# Patient Record
Sex: Female | Born: 1935 | Hispanic: No | Marital: Married
Health system: Southern US, Community
[De-identification: ages and names within clinical notes are randomized; demographics above are authoritative.]

---

## 2004-12-09 ENCOUNTER — Emergency Department: Payer: Self-pay | Admitting: Emergency Medicine

## 2005-06-21 ENCOUNTER — Other Ambulatory Visit: Payer: Self-pay

## 2005-06-21 ENCOUNTER — Inpatient Hospital Stay: Payer: Self-pay | Admitting: Internal Medicine

## 2010-08-19 ENCOUNTER — Inpatient Hospital Stay: Payer: Self-pay | Admitting: Internal Medicine

## 2010-08-19 DIAGNOSIS — I359 Nonrheumatic aortic valve disorder, unspecified: Secondary | ICD-10-CM

## 2010-08-19 DIAGNOSIS — I4891 Unspecified atrial fibrillation: Secondary | ICD-10-CM

## 2011-04-23 ENCOUNTER — Emergency Department: Payer: Self-pay | Admitting: Emergency Medicine

## 2012-05-21 ENCOUNTER — Emergency Department: Payer: Self-pay

## 2012-05-21 LAB — CBC
HCT: 27.1 % — ABNORMAL LOW (ref 35.0–47.0)
MCH: 23.1 pg — ABNORMAL LOW (ref 26.0–34.0)
MCV: 73 fL — ABNORMAL LOW (ref 80–100)
Platelet: 415 10*3/uL (ref 150–440)
RBC: 3.71 10*6/uL — ABNORMAL LOW (ref 3.80–5.20)
WBC: 10.7 10*3/uL (ref 3.6–11.0)

## 2012-05-21 LAB — URINALYSIS, COMPLETE
Bilirubin,UR: NEGATIVE
Ketone: NEGATIVE
Nitrite: NEGATIVE
RBC,UR: 9 /HPF (ref 0–5)
Squamous Epithelial: NONE SEEN
WBC UR: 36 /HPF (ref 0–5)

## 2012-05-21 LAB — BASIC METABOLIC PANEL
Anion Gap: 8 (ref 7–16)
Calcium, Total: 9.1 mg/dL (ref 8.5–10.1)
Chloride: 102 mmol/L (ref 98–107)
Co2: 28 mmol/L (ref 21–32)
Creatinine: 0.55 mg/dL — ABNORMAL LOW (ref 0.60–1.30)
Potassium: 4 mmol/L (ref 3.5–5.1)

## 2012-08-16 ENCOUNTER — Emergency Department: Payer: Self-pay | Admitting: Internal Medicine

## 2012-08-16 LAB — URINALYSIS, COMPLETE
Bilirubin,UR: NEGATIVE
Ketone: NEGATIVE
Nitrite: NEGATIVE
Ph: 6 (ref 4.5–8.0)
RBC,UR: 4 /HPF (ref 0–5)
Specific Gravity: 1.01 (ref 1.003–1.030)
Squamous Epithelial: NONE SEEN

## 2012-08-29 ENCOUNTER — Emergency Department: Payer: Self-pay | Admitting: Emergency Medicine

## 2012-08-29 LAB — COMPREHENSIVE METABOLIC PANEL
Chloride: 99 mmol/L (ref 98–107)
Co2: 24 mmol/L (ref 21–32)
EGFR (African American): >60
EGFR (Non-African Amer.): >60
Glucose: 328 mg/dL — ABNORMAL HIGH (ref 65–99)
Osmolality: 280 (ref 275–301)
Potassium: 3.5 mmol/L (ref 3.5–5.1)
SGOT(AST): 73 U/L — ABNORMAL HIGH (ref 15–37)
SGPT (ALT): 24 U/L (ref 12–78)
Total Protein: 7.2 g/dL (ref 6.4–8.2)

## 2012-08-29 LAB — URINALYSIS, COMPLETE
Bilirubin,UR: NEGATIVE
Blood: NEGATIVE
Glucose,UR: 150 mg/dL (ref 0–75)
Nitrite: NEGATIVE
Ph: 5 (ref 4.5–8.0)
RBC,UR: 5 /HPF (ref 0–5)
Specific Gravity: 1.024 (ref 1.003–1.030)
WBC UR: NONE SEEN /HPF (ref 0–5)

## 2012-08-29 LAB — CBC
HCT: 25.7 % — ABNORMAL LOW (ref 35.0–47.0)
MCH: 19.8 pg — ABNORMAL LOW (ref 26.0–34.0)
MCHC: 29.6 g/dL — ABNORMAL LOW (ref 32.0–36.0)
MCV: 67 fL — ABNORMAL LOW (ref 80–100)
Platelet: 495 10*3/uL — ABNORMAL HIGH (ref 150–440)
RBC: 3.84 10*6/uL (ref 3.80–5.20)
RDW: 19.1 % — ABNORMAL HIGH (ref 11.5–14.5)

## 2012-08-29 LAB — TROPONIN I: Troponin-I: 0.09 ng/mL — ABNORMAL HIGH

## 2012-09-04 LAB — CULTURE, BLOOD (SINGLE)

## 2012-09-06 ENCOUNTER — Ambulatory Visit: Payer: Self-pay | Admitting: Internal Medicine

## 2012-09-18 ENCOUNTER — Inpatient Hospital Stay: Payer: Self-pay | Admitting: Internal Medicine

## 2012-09-18 LAB — URINALYSIS, COMPLETE
Glucose,UR: >=500 mg/dL (ref 0–75)
Leukocyte Esterase: NEGATIVE
Nitrite: NEGATIVE
RBC,UR: NONE SEEN /HPF (ref 0–5)
Specific Gravity: 1.023 (ref 1.003–1.030)

## 2012-09-18 LAB — CBC
HCT: 22 % — ABNORMAL LOW (ref 35.0–47.0)
HGB: 6.2 g/dL — ABNORMAL LOW (ref 12.0–16.0)
MCH: 18.6 pg — ABNORMAL LOW (ref 26.0–34.0)
MCHC: 28.3 g/dL — ABNORMAL LOW (ref 32.0–36.0)
RBC: 3.35 10*6/uL — ABNORMAL LOW (ref 3.80–5.20)
RDW: 20.2 % — ABNORMAL HIGH (ref 11.5–14.5)

## 2012-09-18 LAB — IRON AND TIBC
Iron Bind.Cap.(Total): 211 ug/dL — ABNORMAL LOW (ref 250–450)
Iron: 17 ug/dL — ABNORMAL LOW (ref 50–170)
Unbound Iron-Bind.Cap.: 194 ug/dL

## 2012-09-18 LAB — COMPREHENSIVE METABOLIC PANEL
Albumin: 1.7 g/dL — ABNORMAL LOW (ref 3.4–5.0)
Alkaline Phosphatase: 115 U/L (ref 50–136)
BUN: 13 mg/dL (ref 7–18)
Bilirubin,Total: 0.3 mg/dL (ref 0.2–1.0)
Calcium, Total: 8.4 mg/dL — ABNORMAL LOW (ref 8.5–10.1)
Chloride: 106 mmol/L (ref 98–107)
Co2: 30 mmol/L (ref 21–32)
EGFR (African American): >60
EGFR (Non-African Amer.): >60
Glucose: 325 mg/dL — ABNORMAL HIGH (ref 65–99)
Osmolality: 294 (ref 275–301)
Potassium: 3 mmol/L — ABNORMAL LOW (ref 3.5–5.1)
SGPT (ALT): 23 U/L (ref 12–78)

## 2012-09-18 LAB — CK TOTAL AND CKMB (NOT AT ARMC)
CK, Total: 57 U/L (ref 21–215)
CK-MB: <0.5 ng/mL — ABNORMAL LOW (ref 0.5–3.6)

## 2012-09-18 LAB — TROPONIN I: Troponin-I: 0.05 ng/mL

## 2012-09-18 LAB — SEDIMENTATION RATE: Erythrocyte Sed Rate: >140 mm/hr — ABNORMAL HIGH (ref 0–30)

## 2012-09-19 LAB — BASIC METABOLIC PANEL
Anion Gap: 4 — ABNORMAL LOW (ref 7–16)
BUN: 16 mg/dL (ref 7–18)
Calcium, Total: 7.8 mg/dL — ABNORMAL LOW (ref 8.5–10.1)
Co2: 29 mmol/L (ref 21–32)
Creatinine: 0.54 mg/dL — ABNORMAL LOW (ref 0.60–1.30)
EGFR (African American): >60
EGFR (Non-African Amer.): >60
Osmolality: 291 (ref 275–301)

## 2012-09-19 LAB — CBC WITH DIFFERENTIAL/PLATELET
Basophil #: 0.1 10*3/uL (ref 0.0–0.1)
Basophil %: 0.3 %
Eosinophil #: 0.1 10*3/uL (ref 0.0–0.7)
Eosinophil %: 0.3 %
HGB: 7.6 g/dL — ABNORMAL LOW (ref 12.0–16.0)
Lymphocyte #: 1.8 10*3/uL (ref 1.0–3.6)
MCH: 21.3 pg — ABNORMAL LOW (ref 26.0–34.0)
MCHC: 30.8 g/dL — ABNORMAL LOW (ref 32.0–36.0)
MCV: 69 fL — ABNORMAL LOW (ref 80–100)
Monocyte #: 1 x10 3/mm — ABNORMAL HIGH (ref 0.2–0.9)
Neutrophil #: 14.1 10*3/uL — ABNORMAL HIGH (ref 1.4–6.5)
Neutrophil %: 82.8 %
Platelet: 337 10*3/uL (ref 150–440)
RBC: 3.59 10*6/uL — ABNORMAL LOW (ref 3.80–5.20)
RDW: 22.9 % — ABNORMAL HIGH (ref 11.5–14.5)

## 2012-09-20 LAB — CBC WITH DIFFERENTIAL/PLATELET
Bands: 3 %
HCT: 23.3 % — ABNORMAL LOW (ref 35.0–47.0)
MCH: 20.4 pg — ABNORMAL LOW (ref 26.0–34.0)
MCHC: 29.7 g/dL — ABNORMAL LOW (ref 32.0–36.0)
MCV: 69 fL — ABNORMAL LOW (ref 80–100)
RBC: 3.39 10*6/uL — ABNORMAL LOW (ref 3.80–5.20)
RDW: 23 % — ABNORMAL HIGH (ref 11.5–14.5)
WBC: 13.2 10*3/uL — ABNORMAL HIGH (ref 3.6–11.0)

## 2012-09-20 LAB — BASIC METABOLIC PANEL
Anion Gap: 7 (ref 7–16)
BUN: 13 mg/dL (ref 7–18)
Calcium, Total: 7.8 mg/dL — ABNORMAL LOW (ref 8.5–10.1)
Chloride: 110 mmol/L — ABNORMAL HIGH (ref 98–107)
Co2: 24 mmol/L (ref 21–32)
Creatinine: 0.64 mg/dL (ref 0.60–1.30)
EGFR (Non-African Amer.): >60
Osmolality: 288 (ref 275–301)
Potassium: 3.8 mmol/L (ref 3.5–5.1)

## 2012-09-21 LAB — CBC WITH DIFFERENTIAL/PLATELET
Bands: 5 %
Basophil: 1 %
Eosinophil: 2 %
HCT: 28.9 % — ABNORMAL LOW (ref 35.0–47.0)
HGB: 8.7 g/dL — ABNORMAL LOW (ref 12.0–16.0)
Lymphocytes: 7 %
MCHC: 30 g/dL — ABNORMAL LOW (ref 32.0–36.0)
Monocytes: 3 %
RBC: 4.07 10*6/uL (ref 3.80–5.20)
RDW: 24.6 % — ABNORMAL HIGH (ref 11.5–14.5)
Segmented Neutrophils: 82 %

## 2012-09-21 LAB — BASIC METABOLIC PANEL
Anion Gap: 8 (ref 7–16)
BUN: 10 mg/dL (ref 7–18)
Calcium, Total: 7.8 mg/dL — ABNORMAL LOW (ref 8.5–10.1)
Chloride: 111 mmol/L — ABNORMAL HIGH (ref 98–107)
Co2: 27 mmol/L (ref 21–32)
Creatinine: 0.6 mg/dL (ref 0.60–1.30)
EGFR (Non-African Amer.): >60
Potassium: 3.2 mmol/L — ABNORMAL LOW (ref 3.5–5.1)
Sodium: 146 mmol/L — ABNORMAL HIGH (ref 136–145)

## 2012-09-21 LAB — VANCOMYCIN, TROUGH: Vancomycin, Trough: 7 ug/mL — ABNORMAL LOW (ref 10–20)

## 2012-09-22 LAB — BASIC METABOLIC PANEL
Anion Gap: 7 (ref 7–16)
BUN: 14 mg/dL (ref 7–18)
Calcium, Total: 7.9 mg/dL — ABNORMAL LOW (ref 8.5–10.1)
Chloride: 111 mmol/L — ABNORMAL HIGH (ref 98–107)
Co2: 27 mmol/L (ref 21–32)
EGFR (African American): >60
EGFR (Non-African Amer.): >60
Glucose: 107 mg/dL — ABNORMAL HIGH (ref 65–99)
Potassium: 2.8 mmol/L — ABNORMAL LOW (ref 3.5–5.1)
Sodium: 145 mmol/L (ref 136–145)

## 2012-09-22 LAB — CBC WITH DIFFERENTIAL/PLATELET
Basophil #: 0.1 10*3/uL (ref 0.0–0.1)
Basophil %: 0.3 %
Eosinophil #: 0.1 10*3/uL (ref 0.0–0.7)
Eosinophil %: 0.6 %
Lymphocyte %: 9.6 %
MCH: 22 pg — ABNORMAL LOW (ref 26.0–34.0)
MCHC: 30.8 g/dL — ABNORMAL LOW (ref 32.0–36.0)
MCV: 71 fL — ABNORMAL LOW (ref 80–100)
Monocyte %: 5 %
Neutrophil %: 84.5 %
Platelet: 375 10*3/uL (ref 150–440)
RBC: 4.07 10*6/uL (ref 3.80–5.20)
WBC: 15.7 10*3/uL — ABNORMAL HIGH (ref 3.6–11.0)

## 2012-09-23 LAB — BASIC METABOLIC PANEL
Calcium, Total: 7.9 mg/dL — ABNORMAL LOW (ref 8.5–10.1)
EGFR (Non-African Amer.): 44 — ABNORMAL LOW
Glucose: 161 mg/dL — ABNORMAL HIGH (ref 65–99)
Potassium: 3.3 mmol/L — ABNORMAL LOW (ref 3.5–5.1)
Sodium: 146 mmol/L — ABNORMAL HIGH (ref 136–145)

## 2012-09-23 LAB — CBC WITH DIFFERENTIAL/PLATELET
Basophil #: 0.1 10*3/uL (ref 0.0–0.1)
HCT: 27.1 % — ABNORMAL LOW (ref 35.0–47.0)
HGB: 8.2 g/dL — ABNORMAL LOW (ref 12.0–16.0)
Lymphocyte #: 1.7 10*3/uL (ref 1.0–3.6)
Lymphocyte %: 10.8 %
MCHC: 30.3 g/dL — ABNORMAL LOW (ref 32.0–36.0)
Monocyte %: 6.1 %
Neutrophil %: 81.4 %
Platelet: 412 10*3/uL (ref 150–440)
RBC: 3.84 10*6/uL (ref 3.80–5.20)
WBC: 16 10*3/uL — ABNORMAL HIGH (ref 3.6–11.0)

## 2012-09-23 LAB — VANCOMYCIN, TROUGH: Vancomycin, Trough: 29 ug/mL (ref 10–20)

## 2012-09-24 LAB — BASIC METABOLIC PANEL
BUN: 16 mg/dL (ref 7–18)
Calcium, Total: 7.9 mg/dL — ABNORMAL LOW (ref 8.5–10.1)
Chloride: 112 mmol/L — ABNORMAL HIGH (ref 98–107)
EGFR (African American): 55 — ABNORMAL LOW
EGFR (Non-African Amer.): 47 — ABNORMAL LOW
Glucose: 203 mg/dL — ABNORMAL HIGH (ref 65–99)
Osmolality: 296 (ref 275–301)
Potassium: 4.4 mmol/L (ref 3.5–5.1)
Sodium: 145 mmol/L (ref 136–145)

## 2012-09-24 LAB — CBC WITH DIFFERENTIAL/PLATELET
Basophil #: 0.1 10*3/uL (ref 0.0–0.1)
Basophil %: 0.4 %
Eosinophil #: 0.3 10*3/uL (ref 0.0–0.7)
HGB: 7.6 g/dL — ABNORMAL LOW (ref 12.0–16.0)
MCH: 21.6 pg — ABNORMAL LOW (ref 26.0–34.0)
MCV: 72 fL — ABNORMAL LOW (ref 80–100)
Neutrophil %: 83.4 %
Platelet: 447 10*3/uL — ABNORMAL HIGH (ref 150–440)
WBC: 15.3 10*3/uL — ABNORMAL HIGH (ref 3.6–11.0)

## 2012-09-24 LAB — MAGNESIUM: Magnesium: 1.5 mg/dL — ABNORMAL LOW

## 2012-09-24 LAB — CULTURE, BLOOD (SINGLE)

## 2012-09-25 LAB — CBC WITH DIFFERENTIAL/PLATELET
Basophil %: 0.4 %
Eosinophil %: 2 %
HCT: 26.4 % — ABNORMAL LOW (ref 35.0–47.0)
Lymphocyte #: 1 10*3/uL (ref 1.0–3.6)
Lymphocyte %: 7.2 %
MCH: 21.4 pg — ABNORMAL LOW (ref 26.0–34.0)
MCHC: 29.5 g/dL — ABNORMAL LOW (ref 32.0–36.0)
Monocyte %: 5.4 %
Neutrophil #: 12.3 10*3/uL — ABNORMAL HIGH (ref 1.4–6.5)
Neutrophil %: 85 %
RBC: 3.64 10*6/uL — ABNORMAL LOW (ref 3.80–5.20)
RDW: 25.7 % — ABNORMAL HIGH (ref 11.5–14.5)

## 2012-09-25 LAB — VANCOMYCIN, TROUGH: Vancomycin, Trough: 26 ug/mL (ref 10–20)

## 2012-09-25 LAB — CREATININE, SERUM
Creatinine: 1.1 mg/dL (ref 0.60–1.30)
EGFR (Non-African Amer.): 47 — ABNORMAL LOW

## 2012-09-26 LAB — CBC WITH DIFFERENTIAL/PLATELET
Basophil #: 0.1 10*3/uL (ref 0.0–0.1)
Eosinophil #: 0.3 10*3/uL (ref 0.0–0.7)
Eosinophil %: 2.5 %
HCT: 26.5 % — ABNORMAL LOW (ref 35.0–47.0)
HGB: 7.8 g/dL — ABNORMAL LOW (ref 12.0–16.0)
Lymphocyte #: 1.4 10*3/uL (ref 1.0–3.6)
MCH: 21.4 pg — ABNORMAL LOW (ref 26.0–34.0)
MCHC: 29.6 g/dL — ABNORMAL LOW (ref 32.0–36.0)
MCV: 72 fL — ABNORMAL LOW (ref 80–100)
Monocyte %: 6.5 %
Neutrophil #: 10.8 10*3/uL — ABNORMAL HIGH (ref 1.4–6.5)
Platelet: 607 10*3/uL — ABNORMAL HIGH (ref 150–440)
RDW: 25.9 % — ABNORMAL HIGH (ref 11.5–14.5)
WBC: 13.5 10*3/uL — ABNORMAL HIGH (ref 3.6–11.0)

## 2012-09-26 LAB — CREATININE, SERUM
Creatinine: 1.27 mg/dL (ref 0.60–1.30)
EGFR (African American): 46 — ABNORMAL LOW
EGFR (Non-African Amer.): 40 — ABNORMAL LOW

## 2012-09-26 LAB — VANCOMYCIN, RANDOM: Vancomycin, Random: 27 ug/mL

## 2012-10-02 LAB — COMPREHENSIVE METABOLIC PANEL
Albumin: 1.6 g/dL — ABNORMAL LOW (ref 3.4–5.0)
Alkaline Phosphatase: 131 U/L (ref 50–136)
BUN: 19 mg/dL — ABNORMAL HIGH (ref 7–18)
Calcium, Total: 8.5 mg/dL (ref 8.5–10.1)
Chloride: 116 mmol/L — ABNORMAL HIGH (ref 98–107)
Glucose: 317 mg/dL — ABNORMAL HIGH (ref 65–99)
Osmolality: 305 (ref 275–301)
Potassium: 3.8 mmol/L (ref 3.5–5.1)
SGPT (ALT): 22 U/L (ref 12–78)
Sodium: 146 mmol/L — ABNORMAL HIGH (ref 136–145)

## 2012-10-02 LAB — CBC
HCT: 28.2 % — ABNORMAL LOW (ref 35.0–47.0)
HGB: 8.4 g/dL — ABNORMAL LOW (ref 12.0–16.0)
MCH: 21.4 pg — ABNORMAL LOW (ref 26.0–34.0)
MCHC: 29.9 g/dL — ABNORMAL LOW (ref 32.0–36.0)
MCV: 72 fL — ABNORMAL LOW (ref 80–100)
Platelet: 655 10*3/uL — ABNORMAL HIGH (ref 150–440)
RDW: 25.8 % — ABNORMAL HIGH (ref 11.5–14.5)
WBC: 22.9 10*3/uL — ABNORMAL HIGH (ref 3.6–11.0)

## 2012-10-03 ENCOUNTER — Inpatient Hospital Stay: Payer: Self-pay | Admitting: Internal Medicine

## 2012-10-03 LAB — URINALYSIS, COMPLETE
Ph: 5 (ref 4.5–8.0)
RBC,UR: 40 /HPF (ref 0–5)
Specific Gravity: 1.017 (ref 1.003–1.030)

## 2012-10-04 LAB — CBC WITH DIFFERENTIAL/PLATELET
Basophil #: 0.1 10*3/uL (ref 0.0–0.1)
Eosinophil #: 0.4 10*3/uL (ref 0.0–0.7)
Eosinophil %: 2.3 %
HGB: 6.9 g/dL — ABNORMAL LOW (ref 12.0–16.0)
Lymphocyte #: 1.1 10*3/uL (ref 1.0–3.6)
Lymphocyte %: 7.1 %
MCH: 21 pg — ABNORMAL LOW (ref 26.0–34.0)
MCHC: 29.5 g/dL — ABNORMAL LOW (ref 32.0–36.0)
Neutrophil %: 84.1 %
RDW: 25.7 % — ABNORMAL HIGH (ref 11.5–14.5)

## 2012-10-04 LAB — URINE CULTURE

## 2012-10-04 LAB — BASIC METABOLIC PANEL
BUN: 19 mg/dL — ABNORMAL HIGH (ref 7–18)
Calcium, Total: 8.1 mg/dL — ABNORMAL LOW (ref 8.5–10.1)
Chloride: 115 mmol/L — ABNORMAL HIGH (ref 98–107)
Glucose: 281 mg/dL — ABNORMAL HIGH (ref 65–99)
Osmolality: 307 (ref 275–301)

## 2012-10-04 LAB — GLUCOSE, RANDOM: Glucose: 457 mg/dL — ABNORMAL HIGH (ref 65–99)

## 2012-10-05 LAB — CBC WITH DIFFERENTIAL/PLATELET
Basophil #: 0.1 10*3/uL (ref 0.0–0.1)
Basophil %: 0.6 %
HCT: 27.3 % — ABNORMAL LOW (ref 35.0–47.0)
HGB: 8.7 g/dL — ABNORMAL LOW (ref 12.0–16.0)
Lymphocyte #: 1 10*3/uL (ref 1.0–3.6)
Lymphocyte %: 8 %
Monocyte #: 0.7 x10 3/mm (ref 0.2–0.9)
Monocyte %: 5.8 %
Neutrophil #: 10.5 10*3/uL — ABNORMAL HIGH (ref 1.4–6.5)
Neutrophil %: 82.9 %
Platelet: 407 10*3/uL (ref 150–440)
RBC: 3.69 10*6/uL — ABNORMAL LOW (ref 3.80–5.20)
RDW: 25.3 % — ABNORMAL HIGH (ref 11.5–14.5)
WBC: 12.7 10*3/uL — ABNORMAL HIGH (ref 3.6–11.0)

## 2012-10-05 LAB — BASIC METABOLIC PANEL
BUN: 14 mg/dL (ref 7–18)
Calcium, Total: 7.9 mg/dL — ABNORMAL LOW (ref 8.5–10.1)
Co2: 21 mmol/L (ref 21–32)
EGFR (African American): 56 — ABNORMAL LOW
EGFR (Non-African Amer.): 48 — ABNORMAL LOW
Osmolality: 301 (ref 275–301)
Potassium: 3.4 mmol/L — ABNORMAL LOW (ref 3.5–5.1)
Sodium: 151 mmol/L — ABNORMAL HIGH (ref 136–145)

## 2012-10-05 LAB — VANCOMYCIN, TROUGH: Vancomycin, Trough: 19 ug/mL (ref 10–20)

## 2012-10-06 LAB — CBC WITH DIFFERENTIAL/PLATELET
Basophil #: 0.1 10*3/uL (ref 0.0–0.1)
Eosinophil #: 0.3 10*3/uL (ref 0.0–0.7)
HGB: 9.7 g/dL — ABNORMAL LOW (ref 12.0–16.0)
Lymphocyte #: 1.1 10*3/uL (ref 1.0–3.6)
Lymphocyte %: 9 %
MCHC: 31.1 g/dL — ABNORMAL LOW (ref 32.0–36.0)
MCV: 75 fL — ABNORMAL LOW (ref 80–100)
Monocyte #: 0.7 x10 3/mm (ref 0.2–0.9)
Monocyte %: 5.5 %
Platelet: 452 10*3/uL — ABNORMAL HIGH (ref 150–440)
RBC: 4.16 10*6/uL (ref 3.80–5.20)
RDW: 25.7 % — ABNORMAL HIGH (ref 11.5–14.5)
WBC: 12.2 10*3/uL — ABNORMAL HIGH (ref 3.6–11.0)

## 2012-10-06 LAB — BASIC METABOLIC PANEL
Anion Gap: 7 (ref 7–16)
BUN: 11 mg/dL (ref 7–18)
EGFR (African American): 50 — ABNORMAL LOW
EGFR (Non-African Amer.): 43 — ABNORMAL LOW
Glucose: 223 mg/dL — ABNORMAL HIGH (ref 65–99)
Potassium: 3.3 mmol/L — ABNORMAL LOW (ref 3.5–5.1)
Sodium: 149 mmol/L — ABNORMAL HIGH (ref 136–145)

## 2012-10-07 ENCOUNTER — Ambulatory Visit: Payer: Self-pay | Admitting: Internal Medicine

## 2012-10-07 LAB — CBC WITH DIFFERENTIAL/PLATELET
Basophil %: 0.6 %
Eosinophil #: 0.3 10*3/uL (ref 0.0–0.7)
Lymphocyte #: 0.9 10*3/uL — ABNORMAL LOW (ref 1.0–3.6)
MCH: 22.6 pg — ABNORMAL LOW (ref 26.0–34.0)
MCHC: 30.6 g/dL — ABNORMAL LOW (ref 32.0–36.0)
Monocyte #: 0.6 x10 3/mm (ref 0.2–0.9)
Monocyte %: 5.1 %
Neutrophil #: 9.7 10*3/uL — ABNORMAL HIGH (ref 1.4–6.5)
Neutrophil %: 83.6 %
RBC: 3.65 10*6/uL — ABNORMAL LOW (ref 3.80–5.20)
RDW: 26.3 % — ABNORMAL HIGH (ref 11.5–14.5)
WBC: 11.6 10*3/uL — ABNORMAL HIGH (ref 3.6–11.0)

## 2012-10-07 LAB — MAGNESIUM: Magnesium: 1.9 mg/dL

## 2012-10-07 LAB — BASIC METABOLIC PANEL
BUN: 9 mg/dL (ref 7–18)
Chloride: 116 mmol/L — ABNORMAL HIGH (ref 98–107)
Creatinine: 1.12 mg/dL (ref 0.60–1.30)
EGFR (Non-African Amer.): 46 — ABNORMAL LOW
Osmolality: 301 (ref 275–301)

## 2012-10-07 LAB — POTASSIUM: Potassium: 3.3 mmol/L — ABNORMAL LOW (ref 3.5–5.1)

## 2012-10-08 LAB — CULTURE, BLOOD (SINGLE)

## 2012-12-05 ENCOUNTER — Emergency Department: Payer: Self-pay | Admitting: Emergency Medicine

## 2012-12-05 LAB — CBC
HGB: 7.8 g/dL — ABNORMAL LOW (ref 12.0–16.0)
MCHC: 31.1 g/dL — ABNORMAL LOW (ref 32.0–36.0)
MCV: 78 fL — ABNORMAL LOW (ref 80–100)
RBC: 3.22 10*6/uL — ABNORMAL LOW (ref 3.80–5.20)
RDW: 21.4 % — ABNORMAL HIGH (ref 11.5–14.5)
WBC: 15.9 10*3/uL — ABNORMAL HIGH (ref 3.6–11.0)

## 2012-12-05 LAB — COMPREHENSIVE METABOLIC PANEL
Albumin: 1.2 g/dL — ABNORMAL LOW (ref 3.4–5.0)
Alkaline Phosphatase: 119 U/L (ref 50–136)
Bilirubin,Total: <0.1 mg/dL — ABNORMAL LOW (ref 0.2–1.0)
Co2: 23 mmol/L (ref 21–32)
Glucose: 123 mg/dL — ABNORMAL HIGH (ref 65–99)
Osmolality: 278 (ref 275–301)
Potassium: 3.6 mmol/L (ref 3.5–5.1)
SGOT(AST): 63 U/L — ABNORMAL HIGH (ref 15–37)
SGPT (ALT): 15 U/L (ref 12–78)
Sodium: 139 mmol/L (ref 136–145)
Total Protein: 5.3 g/dL — ABNORMAL LOW (ref 6.4–8.2)

## 2012-12-05 LAB — TROPONIN I: Troponin-I: 0.02 ng/mL

## 2013-03-09 DEATH — deceased

## 2014-01-08 IMAGING — CR PELVIS - 1-2 VIEW
1 series · 1 of 1 positions shown · non-contrast
Comparison: none

REASON FOR EXAM: left leg pain per family
COMMENTS:

[ap]
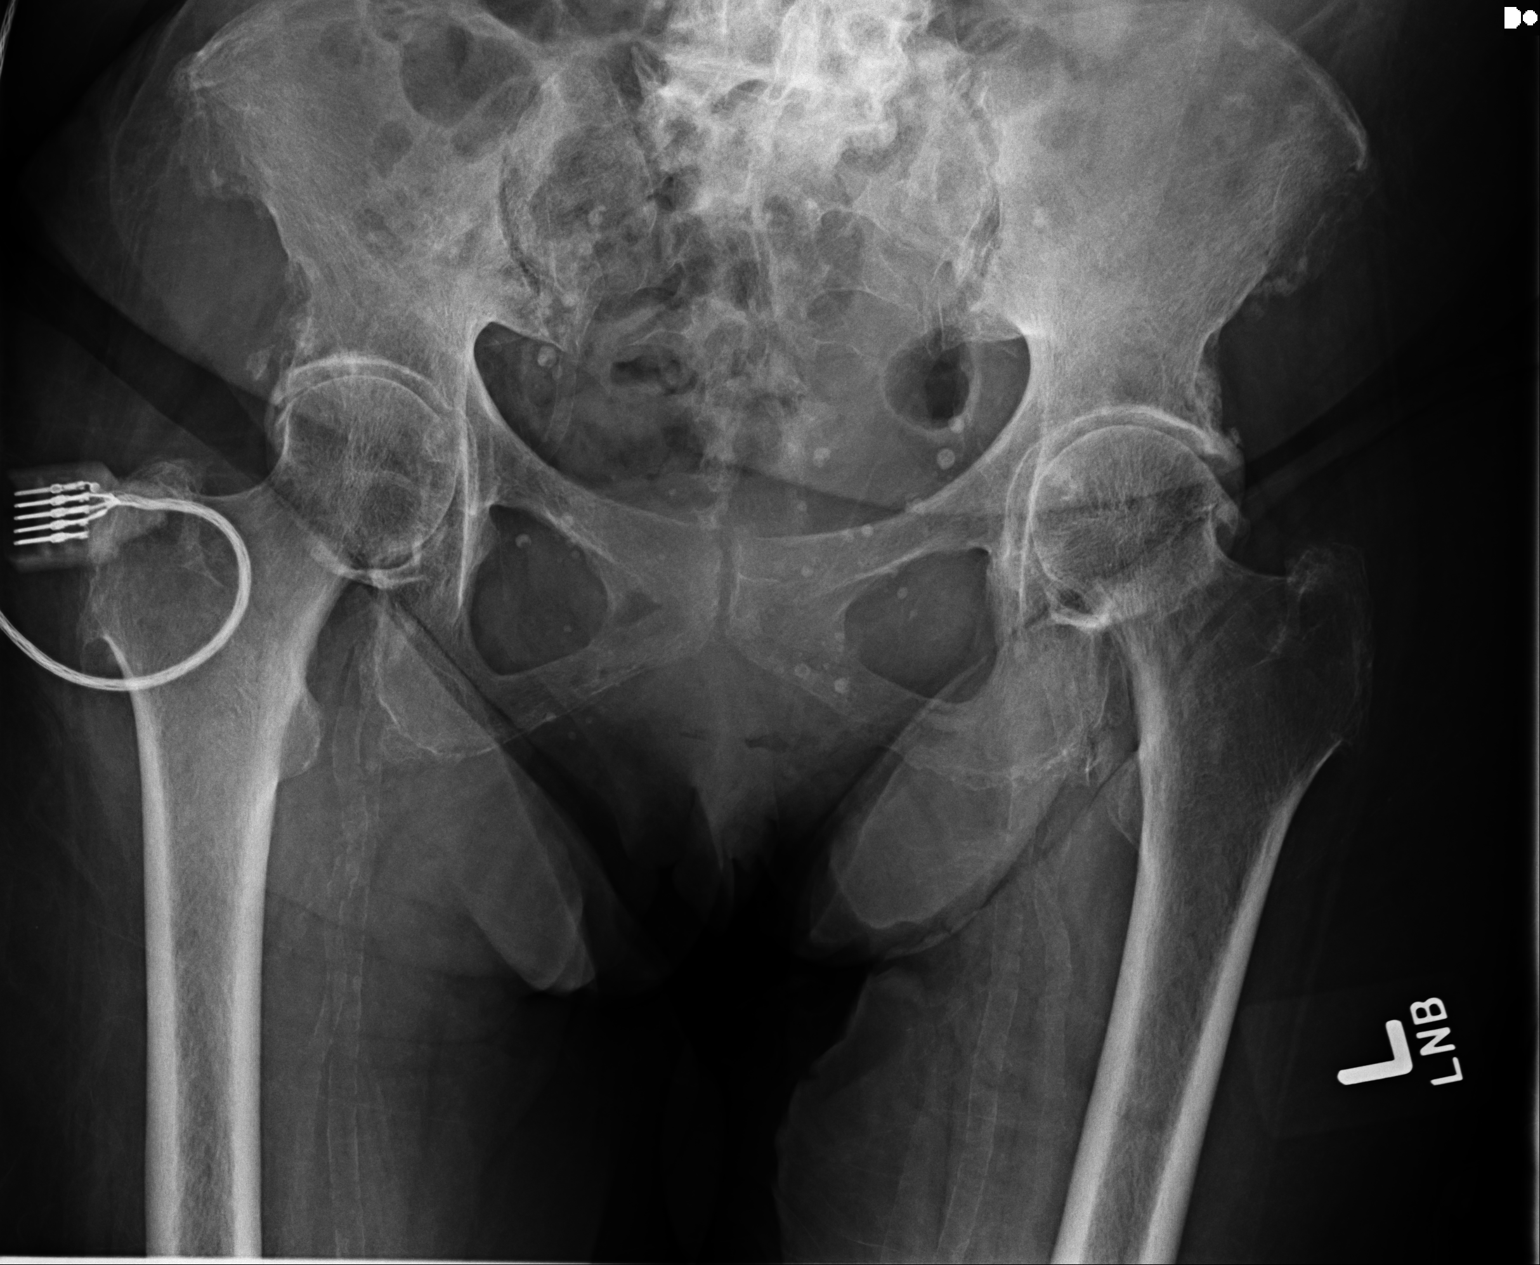

[1 of 1 positions shown; findings below may reference images not displayed]

PROCEDURE:     DXR - DXR PELVIS AP ONLY  - August 29, 2012 [DATE]

RESULT:     Single image of the pelvis does not include the upper iliac
crest region. Lumbar degenerative changes in lumbosacral degenerative
changes are present. There does not appear to be definite pelvic fracture or
hip fracture evident. If the patient has symptoms in the pubic region then
dedicated pubic bone imaging would be beneficial.
IMPRESSION: 1. Degenerative changes. Poindexter atherosclerotic calcification is present. No
acute bony abnormality evident.

[REDACTED]

## 2014-01-08 IMAGING — CR DG CHEST 1V PORT
1 series · 1 of 1 positions shown · non-contrast
Comparison: none

REASON FOR EXAM: ams
COMMENTS:

[ap]
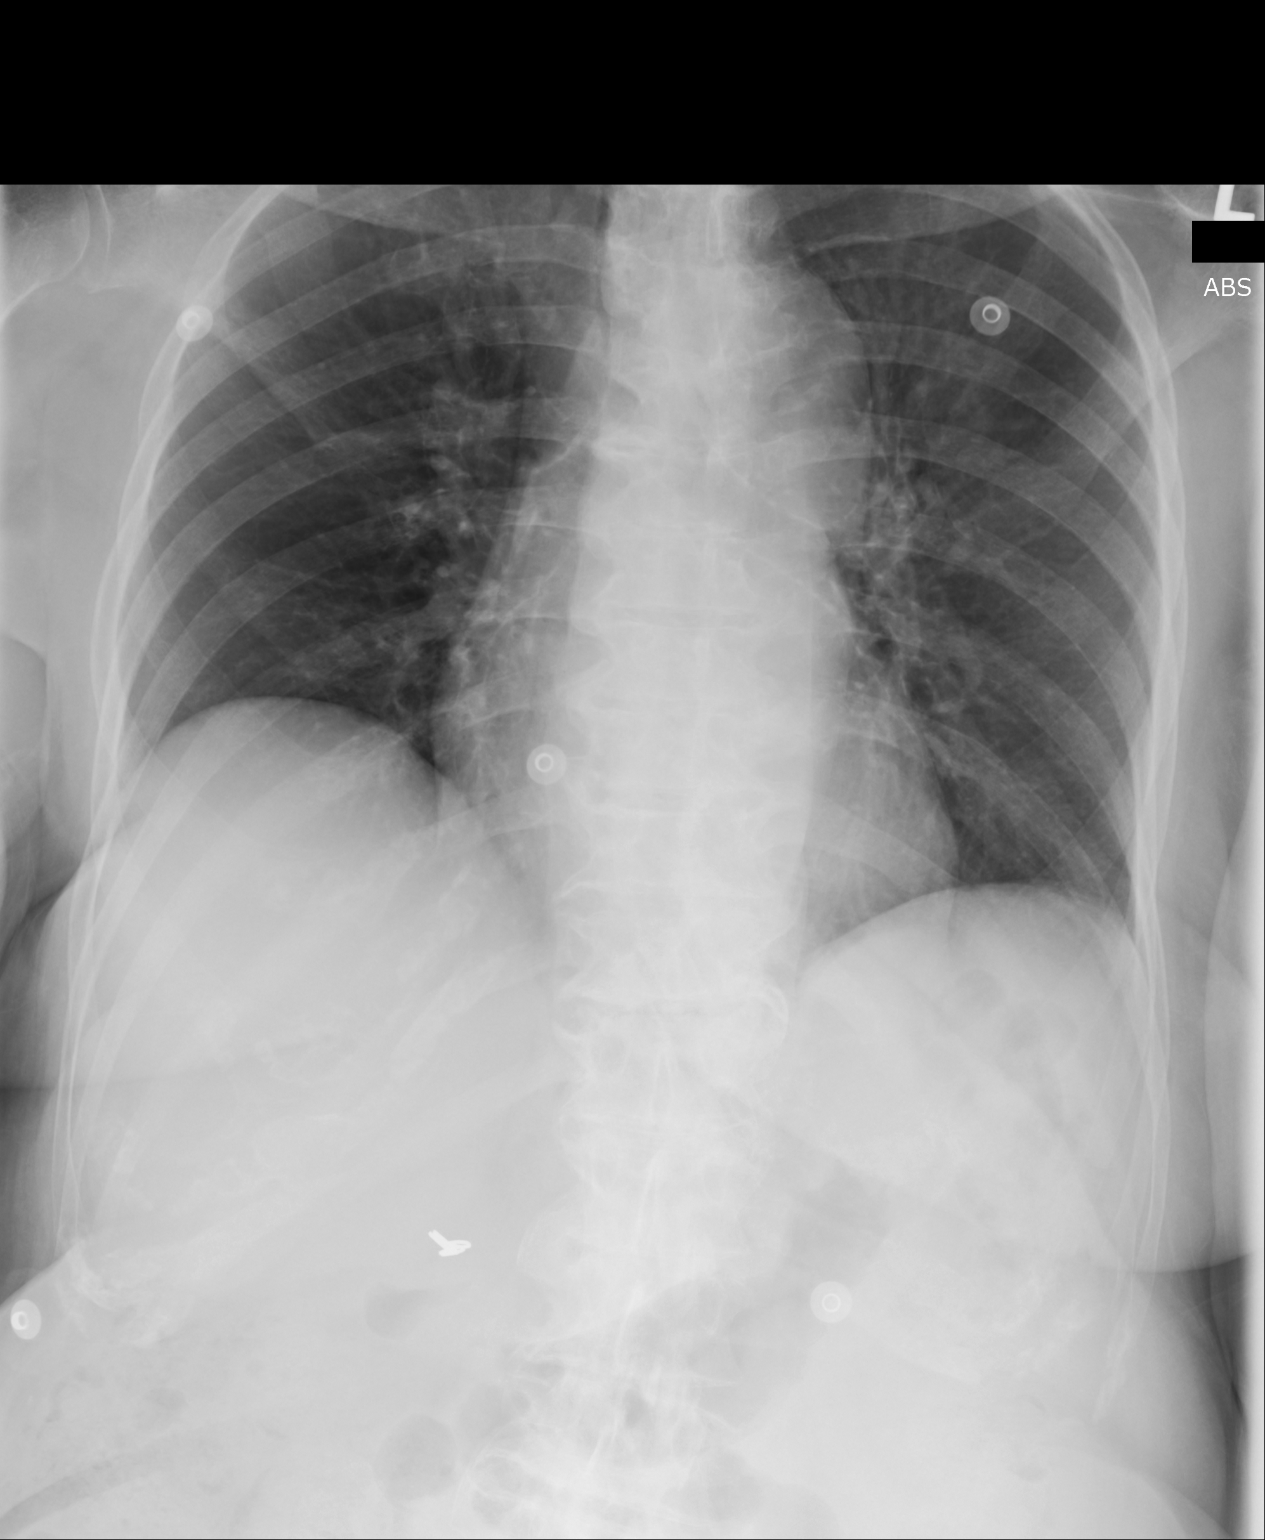

[1 of 1 positions shown; findings below may reference images not displayed]

PROCEDURE:     DXR - DXR PORTABLE CHEST SINGLE VIEW  - August 29, 2012 [DATE]

RESULT:     Comparison is made to the previous exam dated 23 April, 2011.

There is hazy increased density over the right upper lobe. Followup PA and
lateral views are recommended to assess for right upper lobe pneumonia. The
lungs otherwise appear clear. The heart is normal in size.
IMPRESSION: Please see above.

[REDACTED]

## 2014-01-08 IMAGING — CR DG FEMUR 2V*R*
2 series · 5 of 5 positions shown · non-contrast
Comparison: none

REASON FOR EXAM: right femur pain
COMMENTS:

PROCEDURE:     DXR - DXR FEMUR RIGHT  - August 29, 2012 [DATE]
RESULT:     Images of the right femur show degenerative changes in the right
knee and right hip without fracture or dislocation.

[Series 1: ap · 0.17mm/px · 3 of 3 slices shown]
[im 1/3]
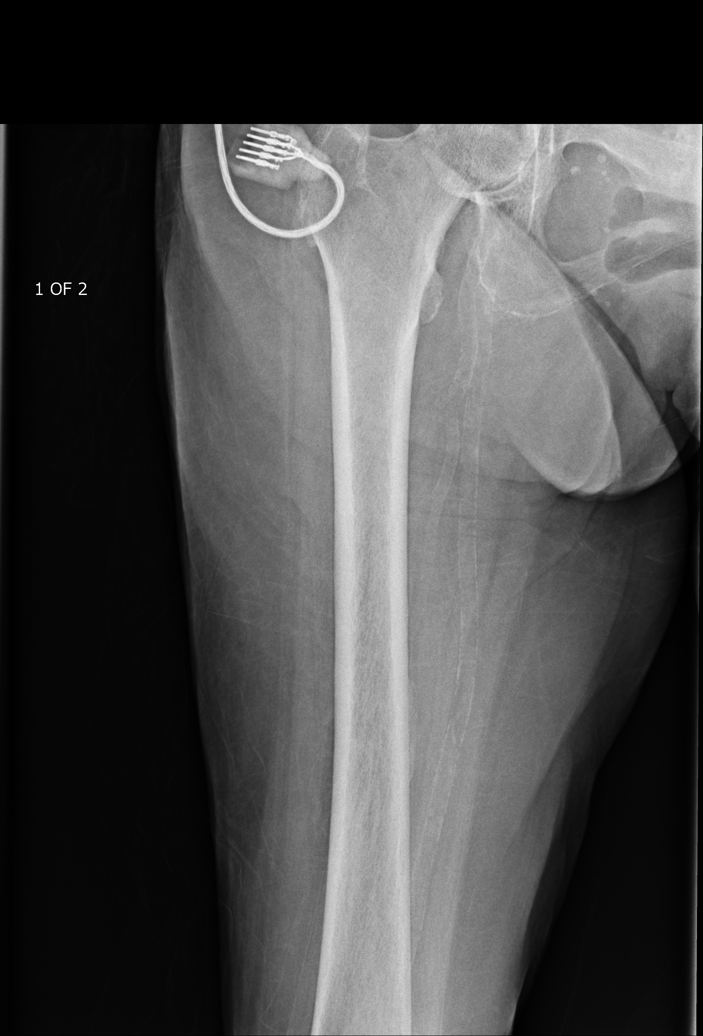
[im 2/3]
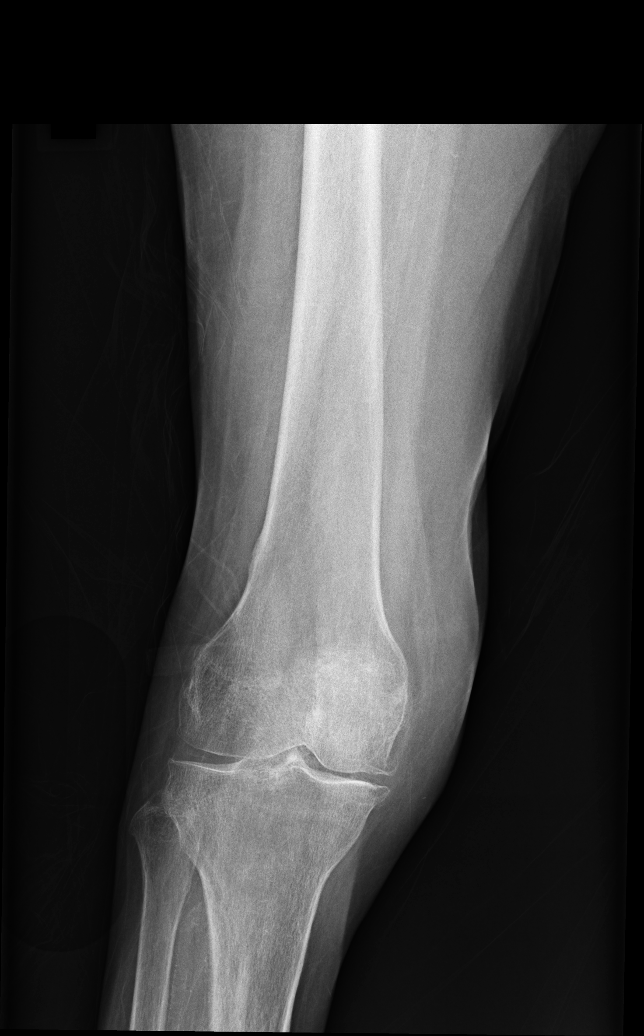
[im 3/3]
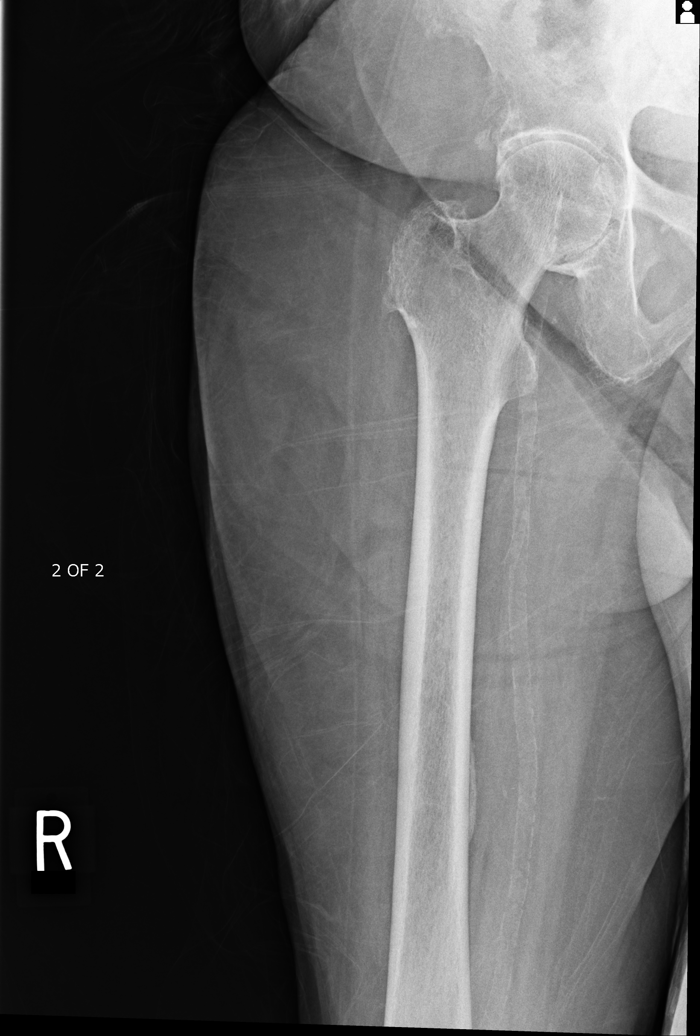

[Series 1: lat · 0.17mm/px · 2 of 2 slices shown]
[im 1/2]
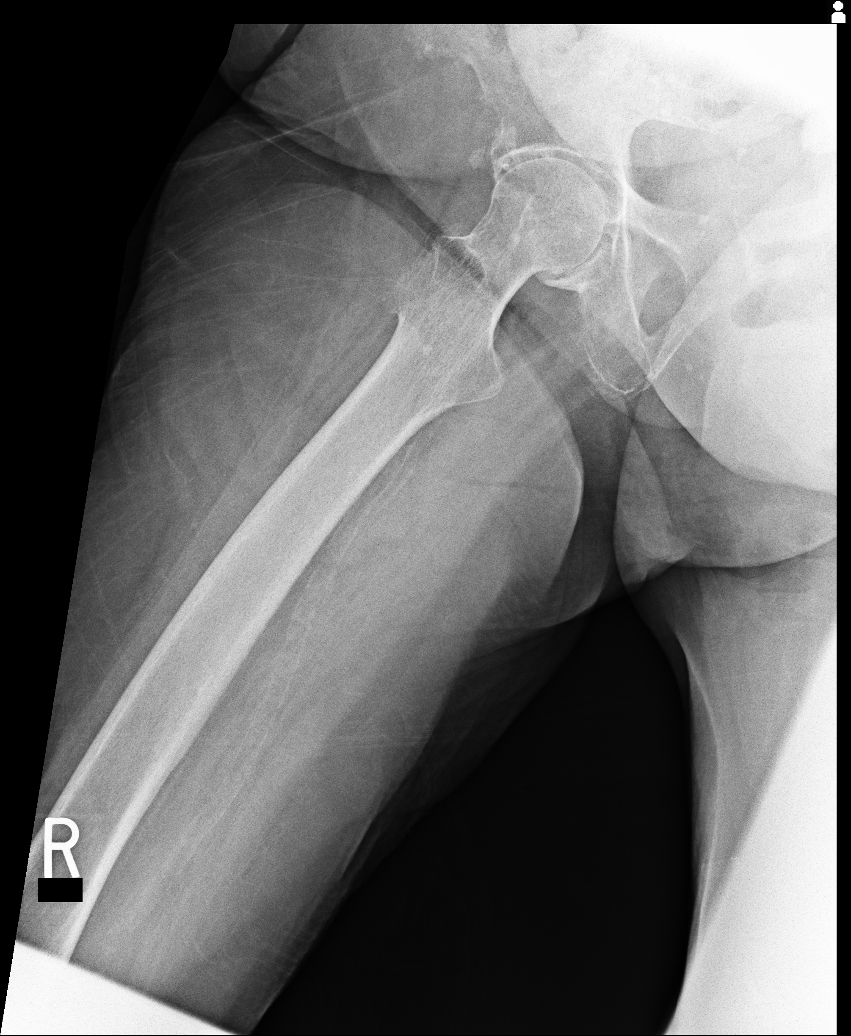
[im 2/2]
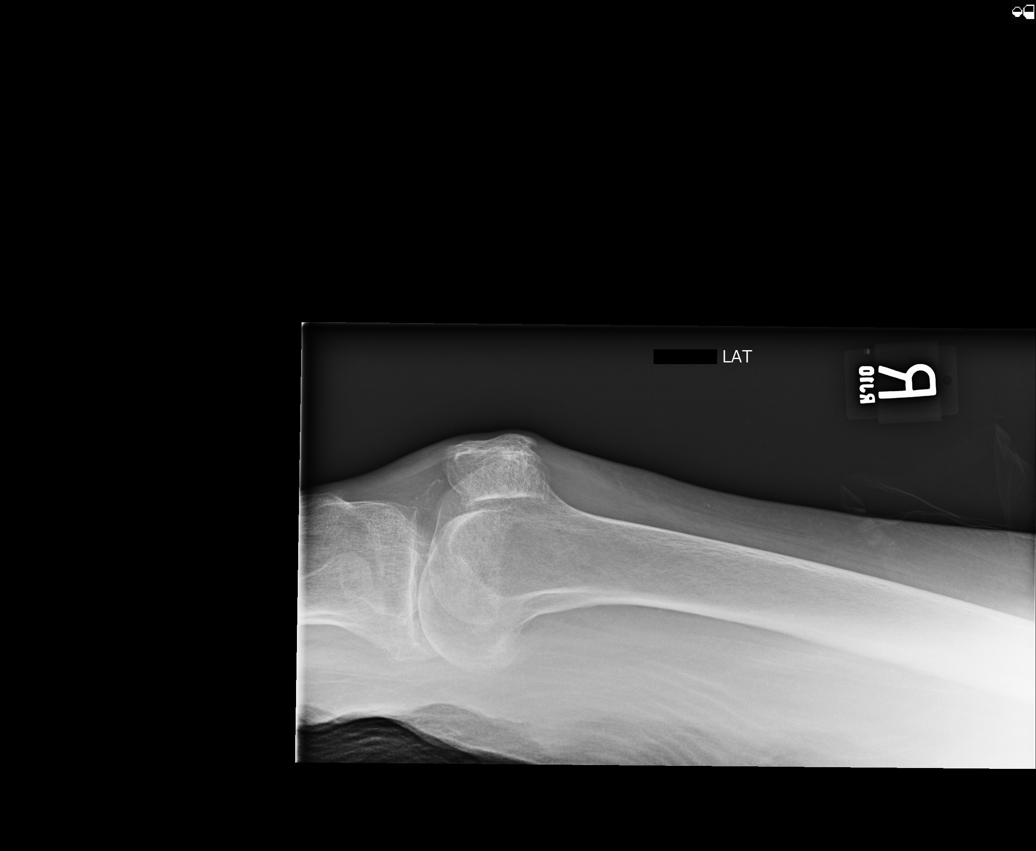

[5 of 5 positions shown; findings below may reference images not displayed]

IMPRESSION: Please see above.

[REDACTED]

## 2014-02-12 IMAGING — CR DG CHEST 1V PORT
1 series · 1 of 1 positions shown · non-contrast
Comparison: none

REASON FOR EXAM: fever and elevated WBC count eval
COMMENTS:

PROCEDURE:     DXR - DXR PORTABLE CHEST SINGLE VIEW  - October 03, 2012 [DATE]
RESULT:     Comparison made to prior study of 09/18/2012. Mild atelectasis
versus infiltrate right lung base. Heart size is normal.

[portable]
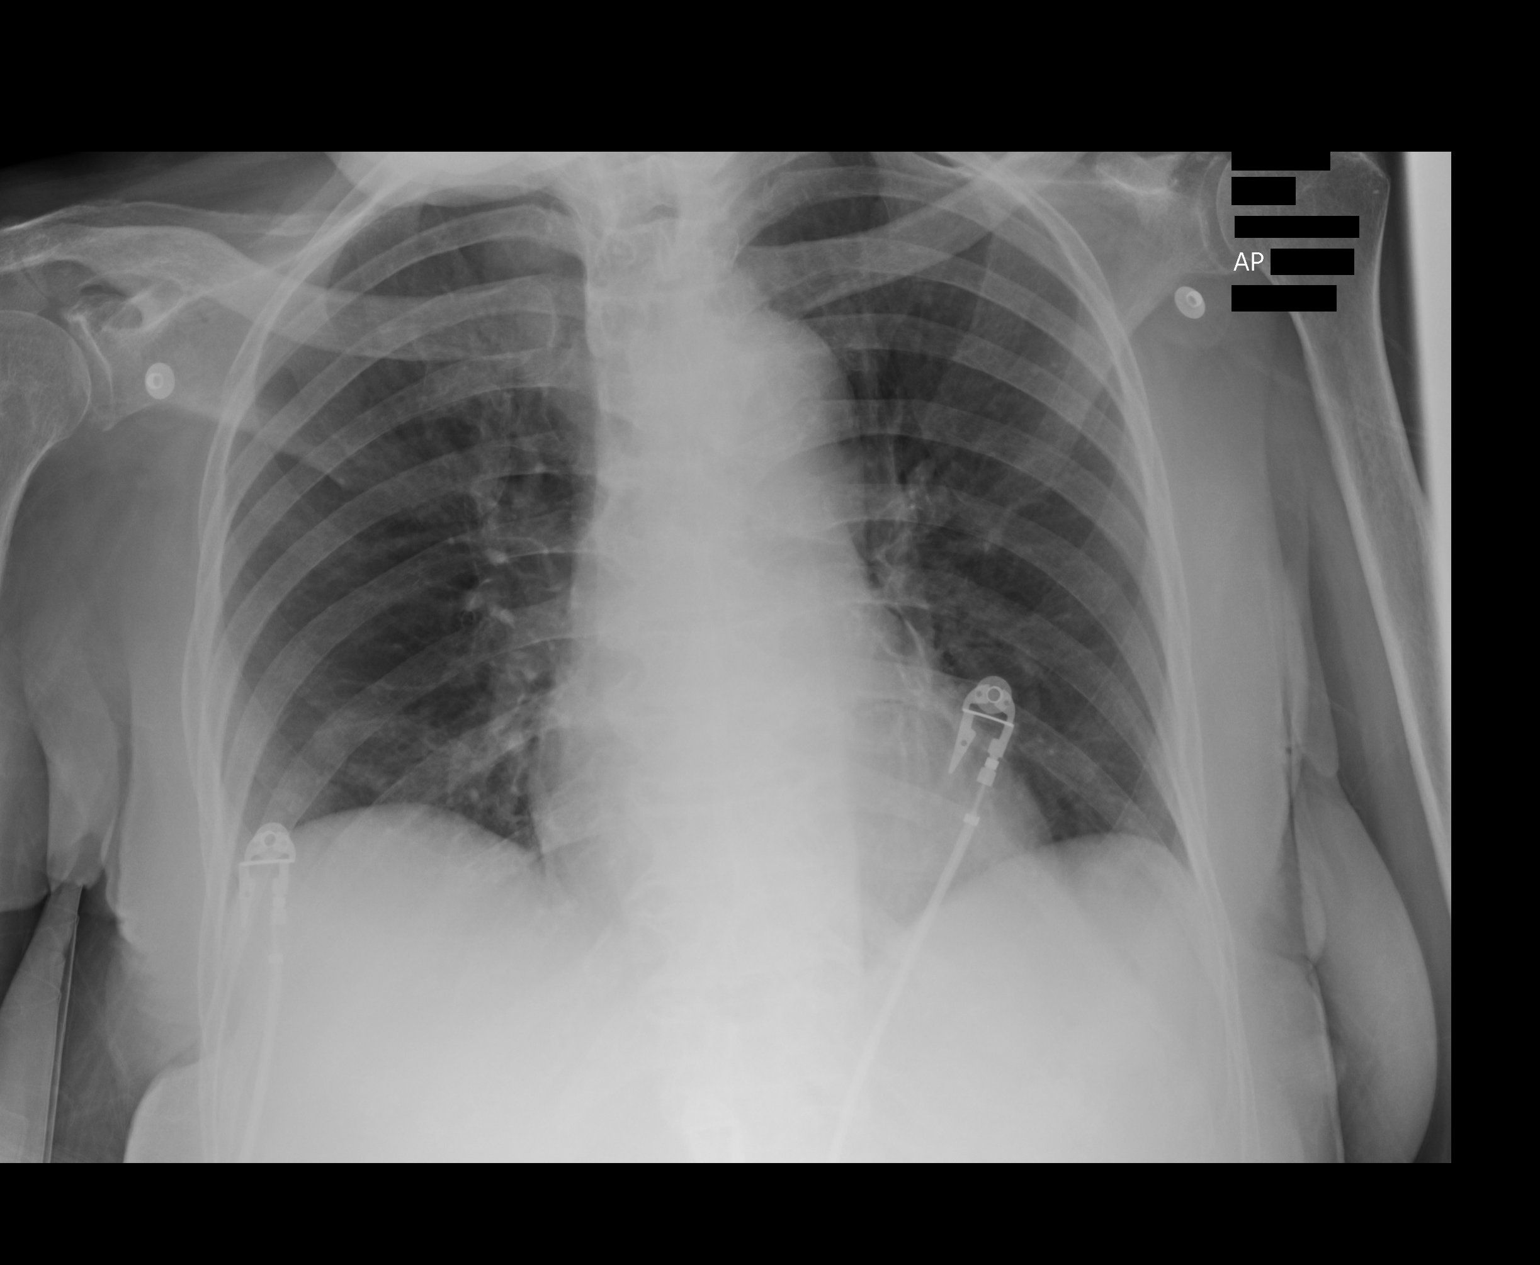

[1 of 1 positions shown; findings below may reference images not displayed]

IMPRESSION: Mild atelectasis versus infiltrate right lung base.

## 2014-10-29 NOTE — H&P (Signed)
PATIENT NAME:  Jennifer [name], Jennifer [name] MR#:  [id number] DATE OF BIRTH:  Nov 26, 1928  DATE OF ADMISSION:  10/03/2012  REFERRING PHYSICIAN:  Dr. Lucrezia EuropeAllison Webster.   PRIMARY CARE PHYSICIAN:  Dr. Dario GuardianJadali.   CHIEF COMPLAINT:  High blood sugar.   HISTORY OF PRESENT ILLNESS:  This is an 81 year old female with significant past medical history of advanced Alzheimer's dementia, bed-bound, nonambulatory, not talkative, with recent hospitalization due to sepsis from infected sacral decubitus ulcer status post debridement, discharged home on wound vac, history obtained from the family at bedside as patient has advanced dementia, unable to talk, the patient remained in the hospital for a week where she was Zosyn for her infected decubitus ulcer which was debrided by Dr. Michela PitcherEly, was home on wound vac, family noticed her blood sugar were uncontrolled which prompted them to bring her to ED, the patient was found to have fever of 102, has leukocytosis of 20,000, initially was tachycardic in the 120s, the patient's urinalysis was positive, as currently she has indwelling Foley catheter, her sacral wound could not be evaluated as she is currently having her wound vac in, but family reports it has been clean last time when it was changed, as well family reports they noticed their mom having chills, with sweating, decreased p.o. intake over the last 4 hours and appears to be more lethargic.  Hospitalist service were requested to admit the patient for further management and work-up of her sepsis, the patient had blood cultures sent in the ED, and was started empirically on IV vancomycin and Rocephin.  As well the family reports the patient has been having some mild bright red blood with her stools which they have been reporting this has been a chronic problem since she is known to have hemorrhoids and reports she is not a candidate for a colonoscopy as they have been told in the past.  PAST MEDICAL HISTORY: 1.  History of atrial  fibrillation.  2.  Hyperlipidemia.  3.  Hypertension.  4.  Type 2 diabetes.  5.  Hypothyroidism.  6.  Alzheimer dementia.  7.  Bedbound, unable to walk.  8.  Recent history of infected sacral decubitus ulcer.   PAST SURGICAL HISTORY: 1.  Thyroidectomy.  2.  Hysterectomy.  3.  Recent sacral ulcer debridement.   ALLERGIES:  No known drug allergies.   HOME MEDICATIONS: 1.  Tylenol as needed.  2.  Aspirin 81 mg daily.  3.  Tramadol 50 mg oral as needed for pain.  4.  Janumet 50/1,000 mg oral daily.  5.  Lovastatin 20 mg oral daily.  6.  Hydrochlorothiazide 25 mg oral daily.  7.  Integra vitamin B complex 1 tablet oral daily.  8.  Potassium 10 mEq oral daily.  9.  Augmentin 1 tablet every 12 hours.  10.  Levothyroxine 88 mcg oral daily.  11.  Hydralazine 10 mg oral 2 times a day.  12.  Vitamin [name] 500 mg oral daily.   SOCIAL HISTORY:  The patient lives with her daughters, family helps take care of her.  As well, they have CNA, there is no history of smoking, alcohol or drug use.   FAMILY HISTORY:  Mother died of Alzheimer's.  Father, unknown history.   REVIEW OF SYSTEMS:  Unable to obtain secondary to dementia.   PHYSICAL EXAMINATION: VITAL SIGNS:  Temperature 98.1, T-max 102.3, pulse 98, pulse on admission 120, respiratory rate 18, blood pressure 140/67, saturating 100% on room air.  GENERAL:  Elderly chronically ill-appearing female  appears malnourished with temporal wasting who looks comfortable in bed in no apparent distress.  HEENT:  Head atraumatic, normocephalic.  Pupils equally round and reactive to light, pale conjunctivae.  No erythema nasal mucosa.  Dry oral mucosa.  NECK:  Supple.  No thyromegaly.  No JVD.  CHEST:  Good air entry bilaterally.  No wheezing, rales, rhonchi, no use of accessory muscles.  CARDIOVASCULAR:  S1, S2 heard.  Irregularly irregular, no gallop or rubs.  Mild systolic ejection murmurs, carotid stroke +2 bilaterally.  No bruits.  EXTREMITIES:   Dorsalis pedis pulses +1 bilaterally.  No edema.  No clubbing.  No cyanosis.  ABDOMEN:  Soft, nontender, nondistended.  Bowel sounds present.  LYMPHATICS:  Has no cervical lymphadenopathy.  MUSCULOSKELETAL:  No joint swelling.  SKIN:  The patient had sacral ulcer which is covered by the wound vac which could not be examined due to the wound vac, but seal appears to be tight and no leakage.  NEUROLOGIC:  Unable to test secondary to dementia.  PSYCHIATRIC:  Unable to test secondary to dementia.   PERTINENT LABORATORY DATA:  Glucose 317, BUN 19, creatinine 1.08, sodium 146, potassium 3.8, chloride 116, CO2 21, albumin 1.6, alkaline phosphatase 131, AST 86, ALT 22.  White blood cells 22.9, hemoglobin 8.4, hematocrit 28.2, platelets 655.  Urinalysis showing 42 white blood cells, +1 bacteria and +2 leukocyte esterase.   ASSESSMENT AND PLAN: 1.  Sepsis, patient presents with sepsis, fever 102.3, tachycardic at 120 with significant leukocytosis of 22.9, sepsis appears to be due to urinary tract infection, the patient was cultured in ED, started on broad-spectrum antibiotics, but meanwhile we were not able to evaluate her sacral ulcer secondary to her wound vac.  2.  Urinary tract infection.  We will follow on the urine culture.  We will continue with Rocephin. 3.  Sacral wound, currently patient has wound vac.  We will continue her on broad-spectrum IV antibiotics including vancomycin and Rocephin until she is evaluated by wound care nurse in a.m. to see if it is infected or not.  4.  Hypernatremia.  This is was due to dehydration.  We will continue with fluids.  5.  Uncontrolled diabetes mellitus.  This is most likely secondary to infection.  We will hold oral agents.  We will have her on insulin sliding scale and if needed we will start her on Lantus.  6.  Hyperlipidemia.  Continue with statin.  7.  Hypothyroidism.  Continue with Synthroid.  8.  Alzheimer's dementia.  Continue with supportive care.  9.   Anemia of chronic disease.  The patient is known to have history of anemia.  This is around her baseline.  We will start her on iron supplement.  10.  Minimal bleeding per rectum, as per family report this has been a chronic problem, secondary to her hemorrhoids.  We will monitor her closely.  We will avoid anticoagulation.  We will hold aspirin and we will transfuse if needed.  11.  Deep vein thrombosis prophylaxis.  Sequential compression device.  12.  GI prophylaxis.  On Protonix.   CODE STATUS:  The patient is DO NOT RESUSCITATE.  Daughters at bedside and confirm DO NOT RESUSCITATE CODE STATUS.   TIME SPENT ON ADMISSION AND PATIENT CARE:  60 minutes.     ____________________________ Starleen Arms, MD dse:ea D: 10/03/2012 05:06:13 ET T: 10/03/2012 06:01:57 ET JOB#: [id number]  cc: Starleen Arms, MD, <Dictator> Tkeyah Burkman Teena Irani MD ELECTRONICALLY SIGNED 10/03/2012 7:39

## 2014-10-29 NOTE — Discharge Summary (Signed)
PATIENT NAME:  Jennifer [name], March C MR#:  [id number] DATE OF BIRTH:  Sep 13, 1928  DATE OF ADMISSION:  10/03/2012 DATE OF DISCHARGE:  10/07/2012  PRIMARY CARE PHYSICIAN:  Marlyn CorporalFayegh H. Jadali, MD  DISCHARGE DIAGNOSES:   1.  Systemic inflammatory response syndrome.  2.  Urinary tract infection.  3.  Diabetes.  4.  Anemia.  5.  Hypernatremia.  6.  Hypokalemia.  7.  Hypothyroidism.  8.  Severe malnutrition.  9.  Dementia.   CODE STATUS:  DO NOT RESUSCITATE.   HOME MEDICATIONS:  [facility].   DIET:  Low-sodium, low-fat, low-cholesterol, ADA diet.   ACTIVITY:  As tolerated.   FOLLOWUP CARE:  With PCP within 1 week. The patient also needs a followup BMP in PCP's office. Continue home care. Resume hospice care at home.   REASON FOR ADMISSION:  High blood sugar.   HOSPITAL COURSE:  The patient is an 81 year old African American female with a history of advanced Alzheimer's disease, A-fib, hypertension and diabetes who was brought to the ED due to high blood sugar. In the ED, the patient was noted to have a fever of 102, leukocytosis of 20,000 and tachycardia in the 120s. The patient's urinalysis was positive for UTI. The patient also had a sacral DU with wound VAC, so the patient was admitted for sepsis, UTI and was treated with vancomycin and Rocephin in the ED. For detailed history and physical examination, please refer to the admission note dictated by Dr. Randol KernElgergawy. After admission, the patient has been treated with vancomycin and Zosyn and IV fluid support. The patient's symptoms have much improved. She recovered to her baseline. White count had decreased to about 11. For sacral wound, the patient has been treated with wound care and wound VAC. Next, the patient also has hypernatremia. Sodium was 151 and decreased to 149 today, which is possibly due to dehydration due to sepsis and UTI. Next, for uncontrolled diabetes, the patient's  p.o. medication was on hold and started Levemir 15 units subcutaneous at bedtime. Her blood sugar is about 200, and the last blood sugar is 192 today. Next, hypokalemia. The patient has a low potassium at 2.6. She has been treated with potassium p.o. daily, and it increased to 3.3 yesterday, but decreased to 2.8 today. We gave potassium p.o. today and it increased to 3.3. Next, for anemia, the patient has anemia of chronic disease. Hemoglobin was 6.9 on March 29th and was treated with 1 unit of PRBC, increased to 9.7; the patient's hemoglobin is 8.3 today. The patient has multiple PRBC transfusion due to anemia, but the patient's daughter declined further GI workup. The patient is clinically stable. Vital signs are stable. She will be discharged to home with hospice care today. I discussed the patient's discharge plan with the patient's daughter and the case manager.   TIME SPENT:  About 43 minutes.    ____________________________ Shaune PollackQing Julliette Frentz, MD qc:si D: 10/07/2012 16:32:00 ET T: 10/07/2012 23:49:15 ET JOB#: [id number]  cc: Shaune PollackQing Adlynn Lowenstein, MD, <Dictator> Shaune PollackQING Laurissa Cowper MD ELECTRONICALLY SIGNED 10/08/2012 21:00

## 2014-10-29 NOTE — Discharge Summary (Signed)
PATIENT NAME:  Jennifer [name], Jennifer [name] MR#:  [id number] DATE OF BIRTH:  Apr 27, 1929  DATE OF ADMISSION:  09/18/2012 DATE OF DISCHARGE:  09/26/2012  PRIMARY CARE PHYSICIAN: Jennifer MayerFeyegh Jadali, MD  DISCHARGE DIAGNOSES:  1.  Sepsis. 2.  Leukocytosis. 3.  Sacral decubitus ulcer infection. 4.  Anemia. 5.  Diabetes. 6.  Hypothyroidism. 7.  Dementia.  PROCEDURE:  Debridement with wound VAC.   CONDITION:  Stable.    CODE STATUS: DO NOT RESUSCITATE.   HOME MEDICATIONS: [facility]. The patient will need antibiotics, Augmentin 875 mg/125 mg p.o. tablets every 12 hours for 14 days.   DIET: Low-sodium, ADA, mechanical soft diet.   ACTIVITY: As tolerated.   FOLLOW-UP CARE: With PCP within 1 to 2 weeks. Follow up with Dr. Michela [name] in wound care in one week.   DISPOSITION:  The patient will be discharged to home with hospice care.   REASON FOR ADMISSION: High blood sugar.    HOSPITAL COURSE: The patient is an 81 year old African American female with a history of dementia, bedbound, AFib, hyperlipidemia, hypertension and diabetes who was sent to the ED due to a high blood sugar. In the ED, the patient was found to have a fever, elevated white count, hemoglobin of 6.2, low potassium at 3. The patient was admitted for clinical sepsis with a stage IV DU infection, possible osteomyelitis. For detailed history and physical examination, please refer to the admission note dictated by Dr. Renae [name].   On admission date, WBC was 9.6, hemoglobin 6.2. Glucose 325, BUN 13, creatinine 0.42, sodium 141, potassium 3.0. Troponin was negative.  Chest x-ray: No pneumonia. Urinalysis: Negative.   Stage IV DU infection: The patient has been treated with Zosyn and vancomycin since admission. Dr. Michela [name] did a wound debridement with wound VAC. The patient's white count decreased from 19.6 to 13.5 today.   Anemia: The patient's hemoglobin was 6.2.  Got 1 unit of PRBC transfusion. Hemoglobin increased to  7.8 and has been stable. The patient has no active bleeding. During hospitalization, the patient had 1 episode of rectal bleeding. We requested a GI consult from doctor. Dr. Bluford [name]. Dr. Bluford [name] discussed with the patient's daughter, but the patient's daughter declined further work-up. According to Dr. Bluford [name], rectal bleeding is possibly due to hemorrhoids.   Diabetes: The patient has been treated with sliding scale and Lantus during hospitalization.   Diarrhea:  The patient has had diarrhea during the hospitalization, but [name]. diff is negative. Diarrhea has resolved.   The patient got a palliative care consult. Dr. Harvie [name] suggested hospice care at home. The patient is clinically stable. Will be discharged to home with hospice care. Discussed the patient's discharge plan with the patient's daughters and case Production designer, theatre/television/filmmanager.   TIME SPENT: About 45 minutes     ____________________________ Jennifer PollackQing Jessie Cowher, MD qc:pd D: 09/26/2012 17:52:14 ET T: 09/27/2012 11:30:46 ET JOB#: [id number]  cc: Jennifer PollackQing Jennifer Windt, MD, <Dictator> Jennifer PollackQING Jennifer Kirkendoll MD ELECTRONICALLY SIGNED 09/27/2012 18:14

## 2014-10-29 NOTE — Op Note (Signed)
PATIENT NAME:  Jennifer [name], Jennifer [name] MR#:  [id number] DATE OF BIRTH:  Nov 14, 1928  DATE OF PROCEDURE:  09/20/2012  PREOPERATIVE DIAGNOSIS:  Stage IV sacral decubitus ulcer.   POSTOPERATIVE DIAGNOSIS:  Stage IV sacral decubitus ulcer.  PROCEDURE PERFORMED:  Sharp excisional debridement of skin, soft tissue muscle and bone measuring 8 x 5 x 3 cm in size with initial application of wound vac-assisted closure device greater than 50 sq cm.   SURGEON:  Raynald KempMark A Davina Howlett, M.D.   ASSISTANT:  Scrub tech  ANESTHESIA:  Local with heavy intravenous sedation consisting of 30 mL of 0.5% Marcaine with epinephrine.   FINDINGS:  Large sacral decubitus ulcer.   SPECIMENS:  None.   DESCRIPTION OF PROCEDURE:  With the patient in the lateral position with the help of a beanbag and intravenous sedation being administered.  The existing dressing was removed.  The wound was prepped and draped with Betadine solution.  Timeout was observed.   Necrotic full-thickness skin and soft tissue was excised with the scalpel.  Furthermore, necrotic fat was debrided including skin with a scalpel.  The fascia was debrided sharply with a scalpel.  Exposed coccygeal bone was debrided with rongeur clamp.  Hemostasis was obtained with point cautery and local anesthesia was infiltrated during the operation.  A piece of black granular foam with silver impregnation was placed into the wound and tracked onto the left hip.  Adequate seal was obtained.  Vac was applied.  The patient was then returned supine and taken to the recovery room in stable and satisfactory condition by anesthesia.     ____________________________ Redge GainerMark A. Egbert GaribaldiBird, MD Verta Ellenmab:ea D: 09/20/2012 11:41:04 ET T: 09/21/2012 02:34:17 ET JOB#: [id number]  cc: Loraine LericheMark A. Egbert GaribaldiBird, MD, <Dictator> Raynald KempMARK A Alec Jaros MD ELECTRONICALLY SIGNED 09/29/2012 21:52

## 2014-10-29 NOTE — Consult Note (Signed)
   Comments   Christin Gusler, NP and I met with 2 daughters and 2 sons. Discussed pt's medical status. They want to take her home and are prepared to continue providing 24 hour assistance. Pt qualifies for hospice services in the home which she has actually had before. Family is interested in restarting hospice in the home. Will order hospice screening. Will also complete F2F documentation.  15 minutes   Electronic Signatures: Borders, Kirt Boys (NP)  (Signed 14-Mar-14 14:26)  Authored: Palliative Care   Last Updated: 14-Mar-14 14:26 by Irean Hong (NP)

## 2014-10-29 NOTE — H&P (Signed)
PATIENT NAME:  Jennifer, [name] MR#:  [id number] DATE OF BIRTH:  [birth year]  DATE OF ADMISSION:  09/18/2012  PRIMARY CARE PHYSICIAN:  Dr Rosario Jacks.  CHIEF COMPLAINT:  High blood sugars.   HISTORY OF PRESENT ILLNESS:  This is an 81 year old female with dementia, bedbound, unable to talk. History obtained from family who is at the bedside. Over the past few weeks blood sugars have been very high. The family has been trying to take care of the patient at home. She is full 24-hour care. Does have a CNA and family. She has been having a bedsore since January. In the ER she was found to have a fever, elevated white counts, a hemoglobin of 6.2, low potassium of 3.0, and Hospitalist Services were contacted for further evaluation. The patient does have a decubitus ulcer on the buttock. The patient again unable to give any history.   PAST MEDICAL HISTORY:  Atrial fibrillation, hyperlipidemia, hypertension, type 2 diabetes, hypothyroidism, Alzheimer's dementia, bedbound, unable to talk.   PAST SURGICAL HISTORY:  Thyroidectomy, hysterectomy.   ALLERGIES:  No known drug allergies.   MEDICATIONS:  As per Prescription Writer include: Aspirin 81 mg daily, hydralazine 10 mg twice a day, hydrochlorothiazide 25 mg daily, Integra B complex, C, and iron daily, Janumet 50/1000, 1 tablet daily, Klor-Con 10 mEq daily, levothyroxine 88 mcg daily, lovastatin 20 mg daily, vitamin C 500 mg, 1 tablet daily.   SOCIAL HISTORY:  Lives in her house. Aide and family take care of her 24/7. 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 No smoking. No alcohol. No drug use.   FAMILY HISTORY:  Mother died of Alzheimer's. Father unknown history.   REVIEW OF SYSTEMS:  Unable to obtain secondary to dementia.   PHYSICAL EXAMINATION: VITAL SIGNS:  Temperature 100.3, pulse 73, respirations 15, blood pressure 96/40, pulse ox 97% on room air.  GENERAL:  No respiratory distress.  EYES:  Conjunctivae pale. Lids normal. Pupils  equal and round and reactive to light. Unable to test extraocular muscles. Tympanic membrane on the right: No erythema. Nasal mucosa: No erythema.  NECK:  No JVD. No bruits. No lymphadenopathy. No thyromegaly. No thyroid nodules palpated.  RESPIRATORY:  Lungs clear to auscultation. No use of accessory muscles to breathe. No rhonchi, rales, or wheeze heard.  CARDIOVASCULAR SYSTEM:  S1, S2 normal. Irregularly irregular. No gallop or rub heard; 2/6 systolic ejection murmur. Carotid upstrokes 2+ bilaterally. No bruits.  EXTREMITIES:  Dorsalis pedis pulses 1+ bilaterally. Trace edema, bilateral lower extremities.  ABDOMEN:  Soft, nontender. Normoactive bowel sounds.  LYMPHATIC:  No lymph nodes in the neck.  MUSCULOSKELETAL:  Trace edema. No clubbing, no cyanosis.  SKIN:  Large estimated 9 cm in diameter circular-type lesion on the buttock, with tunneling and necrotic material, likely stage IV decubiti.  NEUROLOGIC:  Unable to test secondary to dementia.  PSYCHIATRIC:  Unable to test secondary to dementia.   LABORATORY AND RADIOLOGICAL DATA:    Urinalysis: 1+ ketones, 1+.   Chest x-ray shows no definite pneumonia.   White blood cell count 9.6, H and H 6.2 and 22.0, platelet count of 431.   Glucose 325, BUN 13, creatinine 0.42, sodium 141, potassium 3.0, chloride 106, CO2 30, calcium 8.4.   Liver function tests:  AST slightly elevated at 131. Troponin negative.   ASSESSMENT AND PLAN: 1.  Clinical sepsis, with stage IV decubiti, possible osteomyelitis, with fever, hypotension, and leukocytosis: This is concerning for a possible osteomyelitis. We will send off  an ESR. May need to obtain an MRI of the sacrum. I did speak with Surgery, Dr. Marina Gravel, to evaluate the patient for a possible debridement. Antibiotics alone will not treat this infection. Will give aggressive antibiotics with vancomycin and Zosyn. Wound culture sent off from ER. I will also get a Palliative Care consultation. The patient is a DO  NOT RESUSCITATE.  2.  Uncontrolled diabetes:  We will put on sliding scale and start Lantus insulin. Sugars elevated secondary to infection.  3.  Anemia, likely anemia of chronic disease: Got consent for transfusion of 1 unit of packed red blood cells, and continue to monitor hemoglobin. Will send off iron studies. Benefits and risks of transfusion explained to the family.  4.  Hypokalemia: Will replace potassium and IV fluids, check a magnesium in the a.m.  5.  Severe malnutrition, with albumin of 1.7: Just because of this overall prognosis is poor.  6. Dementia:  Unable to give any history. Bedbound. Would qualify for hospice with this diagnosis. The patient is a DO NOT RESUSCITATE. We will get Palliative Care consultation.  7.  History of hypertension:  Hold blood pressure medications with hypotension.  8.  Hypothyroidism:  Continue Synthroid.  9.  Hyperlipidemia:  Hold the cholesterol medication at this point.   Time spent on admission: 50 minutes.    ____________________________ Tana Conch. Leslye Peer, MD rjw:dm D: 09/18/2012 13:38:00 ET T: 09/18/2012 13:58:42 ET JOB#: [id number]  cc: Isla Pence, MD Tana Conch. Leslye Peer, MD, <Dictator>  Marisue Brooklyn MD ELECTRONICALLY SIGNED 09/18/2012 19:10

## 2014-10-29 NOTE — Consult Note (Signed)
Pt in OR. Thus, not able to see patient today. Please refer to Jennifer [name]'s notes. Does have hemorrhoids. Not a good candidate for colonoscopy. Treat hemorrhoids. Moniter hgb. Will check back tomorrow. Thanks.  Electronic Signatures: Lutricia Feilh, Dalton Mille (MD)  (Signed on 19-Mar-14 16:40)  Authored  Last Updated: 19-Mar-14 16:40 by Lutricia Feilh, Claryssa Sandner (MD)

## 2014-10-29 NOTE — Consult Note (Signed)
Pt sleeping. Family members present. Discussed about endoscopic evaluations. Family decided against it. Family aware that patient not a good candidate for colonoscopy. Treat for hemorrhoids and moniter hgb. Will sign off. Thanks.  Electronic Signatures: Lutricia Feilh, Cem Kosman (MD)  (Signed on 20-Mar-14 08:15)  Authored  Last Updated: 20-Mar-14 08:15 by Lutricia Feilh, Garrus Gauthreaux (MD)

## 2014-10-29 NOTE — Consult Note (Signed)
PATIENT NAME:  Jennifer [name], [name] MR#:  [id number] DATE OF BIRTH:  03/09/1929  DATE OF CONSULTATION:  09/19/2012  CONSULTING PHYSICIAN:  Loraine LericheMark A. Egbert GaribaldiBird, MD  REASON FOR CONSULTATION:  Evaluation of sacral decubitus ulcer.   HISTORY: This is an 81 year old bedbound black female with a history of Alzheimer's dementia, diabetes and anemia admitted to the medical service on the 13th of March with anemia and hypokalemia and hyperglycemia. She has history of atrial fibrillation, hyperlipidemia, hypertension, type 2 diabetes, hypothyroidism, Alzheimer's dementia. The patient lives at home being taken care of by her family. She has developed a large bedsore. Surgical services were asked to consult.   ALLERGIES:  None.   MEDICATIONS:  Iron, Synthroid, Zosyn, potassium chloride, vancomycin, Lantus, insulin, sliding scale insulin, magnesium, acetaminophen and Zofran.   PAST SURGICAL HISTORY:  Significant for thyroidectomy, hysterectomy.   SOCIAL HISTORY:  Lives at home. No drugs. No alcohol use.   FAMILY HISTORY:  Noncontributory.  REVIEW OF SYSTEMS:  Unobtainable.   PHYSICAL EXAMINATION: GENERAL:  The patient is resting comfortably, family at bedside.  VITAL SIGNS:  Temperature is 99.4, pulse of 84, respiratory rate of 18. Blood pressure is 94/57. Room air saturation is 99%.  Examination of her sacrum demonstrates a 10 x 10 cm area of full thickness necrosis with palpable bone within the base of the wound with surrounding cellulitis.  LABORATORY VALUES:  Potassium was 3.0, glucose 185, chloride 110. After 2 units of blood transfusion, hemoglobin 7.6, hematocrit 24.8, platelet count 237,000, white count 17,000.   IMPRESSION:  Stage IV decubitus ulcer of the sacrum in a bedbound patient who is a DNR and no code.   PLAN:  For debridement in the operating room on 09/20/2012 under general anesthesia or MAC anesthesia to control the acute infection and work towards the wound VAC and discharged with  hospice home health.     ____________________________ Redge GainerMark A. Egbert GaribaldiBird, MD mab:ce D: 09/20/2012 10:11:43 ET T: 09/20/2012 17:05:07 ET JOB#: [id number]  cc: Loraine LericheMark A. Egbert GaribaldiBird, MD, <Dictator> Raynald KempMARK A Kidus Delman MD ELECTRONICALLY SIGNED 09/29/2012 21:49

## 2014-10-29 NOTE — Op Note (Signed)
PATIENT NAME:  Jennifer [name], Jennifer [name] MR#:  [id number] DATE OF BIRTH:  [birth year]  DATE OF PROCEDURE:  09/24/2012  PREOPERATIVE DIAGNOSIS: Sacral decubitus.   PREOPERATIVE DIAGNOSIS: Sacral decubitus.   PROCEDURE: Wound VAC change under anesthesia.   ANESTHESIA: Monitored anesthesia care.  SURGEON: Quentin Orealph L. Ely, MD.  Threasa HeadsASSISTANGlenetta Hew: [place], PA student.   OPERATIVE PROCEDURE: With the patient in the supine position after induction of appropriate intravenous sedation, she was moved to the right side down, left side up position, appropriately padded and positioned. The area was prepped with Betadine and draped in sterile towels. Minimal debridement was accomplished, as most of the tissue was in good shape. There were a couple of areas of necrosis which were removed. A wound VAC was then placed using Ioban on the interior layer for sealing the defect. White and black foam was utilized, the white foam under the overlapping skin edges. The patient was returned to the recovery room having tolerated the procedure well. Sponge, instrument and needle counts were correct x 2 in the operating room.   ____________________________ Quentin Orealph L. Ely III, MD rle:jm D: 09/24/2012 15:16:45 ET T: 09/24/2012 15:42:25 ET JOB#: [id number]  cc: Quentin Orealph L. Ely III, MD, <Dictator> Quentin OreALPH L ELY MD ELECTRONICALLY SIGNED 09/25/2012 10:41

## 2014-10-29 NOTE — Consult Note (Signed)
PATIENT NAME:  Jennifer Jennifer [name], Jennifer Jennifer [name] MR#:  [id number] DATE OF BIRTH:  01-05-1929  DATE OF CONSULTATION:  09/24/2012  REFERRING PHYSICIAN:  Dr. Allena Jennifer [name] CONSULTING PHYSICIAN:  Jennifer FeilPaul Oh, MD/Jennifer Jennifer [name] M. Jennifer Claytor, PA-Jennifer [name]  REASON FOR CONSULTATION: Bright red blood per rectum and anemia.   HISTORY OF PRESENT ILLNESS: This is a pleasant 81 year old female with a past medical history significant for advanced age dementia who is nonverbal and is bedridden. She is accompanied by several family members in the hospital room who were able to provide much of the history. She was initially admitted into the hospital with concerns of elevated blood sugars; however, she was also found to have a large sacral decubitus ulcer that looked quite infected. She was febrile as well as had an elevated white count, and there was some concern for sepsis as well as possible underlying osteomyelitis in relationship to this ulcer. She was evaluated by General Surgery, who did take her in for debridement of the skin, soft tissue, muscle and bone on 09/20/2012, and she is status post wound VAC. She is scheduled to go in for Wound VAC change later on today. Of note, she has had a low hemoglobin since admission. Her hemoglobin at admission was 6.2.  She is status post 2 units of packed red blood cells, and it has gotten as high as 9.0; however, today it is back down to 7.6. This is a microcytic anemia with an MCV in the range between 65 and 75. TIBC was 211 and iron was 17. She is on IV antibiotics, including vancomycin and Zosyn, for the underlying sepsis and infected ulcer. When speaking with the patient's family, they do state that they notice bright red blood per rectum following bowel movements, and she has been constipated recently. She also has external hemorrhoids. Last colonoscopy was approximately 15 years ago, and the family is unsure of the details of this. They do follow with Dr. Niel Jennifer [name] as an outpatient, who had previously told them that she  is not a candidate for a colonoscopy due to coexisting comorbidities. There has been no melena. There is no nausea or vomiting. The patient does show facial expressions periodically expressing pain but does not hold any specific area, and it is unclear if this is coming from the ulcer or if she is having abdominal pain. There is no fever or chills. There is no chest pain or shortness of breath. She does have a good appetite, and she does eat p.o. food as well as supplements with Ensure.  Of note, albumin was 1.7, notable for significant malnutrition, and palliative care was consulted.   ALLERGIES: No known drug allergies.   HOME MEDICATIONS:  Aspirin 81 mg, hydralazine 10 mg twice a day, hydrochlorothiazide 25 mg daily, Integra B complex with Jennifer [name], Janumet 50/1000 once daily, Klor-Con 10 mEq daily, levothyroxine 88 mcg daily, lovastatin 20 mg daily, vitamin Jennifer [name].  PAST MEDICAL HISTORY: Atrial fibrillation, dyslipidemia, hypertension, type 2 diabetes mellitus, hypothyroidism, Alzheimer's dementia causing her to be bedbound and nonverbal, also history of external hemorrhoids and a newly-diagnosed decubitus ulcer that was stage IV.   PAST SURGICAL HISTORY: Thyroidectomy and hysterectomy.   SOCIAL HISTORY: The patient does live at home and does have 24-hours a day, 7-days a week care giving and does have a very involved family; and they state that she is never alone. No alcohol, tobacco or illicit drug use.   FAMILY HISTORY: Mother with Alzheimer's. No known family history  of GI malignancy or IBD.   REVIEW OF SYSTEMS: Difficult to obtain secondary to dementia, but the family is able to piece together much of the history as mentioned above.   PHYSICAL EXAMINATION:  VITAL SIGNS: Blood pressure 127/70, heart rate 82, respirations 19, temperature 97.6, bedside pulse ox is 100%.  GENERAL: This is an 81 year old female resting quietly and comfortably in bed in no acute distress,  alert but unable to decipher if she is oriented due to her being nonverbal.  NECK: Supple. No lymphadenopathy noted.  HEENT: Sclerae anicteric. Mucous membranes are moist.  PULMONARY: Respirations are even and clear to auscultation in bilateral anterior lung fields.  HEART: Regular rate and rhythm. S1, S2 noted.  ABDOMEN: Soft, nontender, nondistended. Normoactive bowel sounds noted in all 4 quadrants. No masses palpated. No guarding or rebound. No hepatomegaly, hernias or masses appreciated.  RECTAL: Protruding external hemorrhoids noted that are nonbleeding. Stool is heme positive with specks of gross bright red blood within the stool as well.  BACK: Wound VAC is noted in the sacral area, and dressings do appear clean and dry.  EXTREMITIES: Negative for lower extremity edema, 2+ pulses noted bilateral upper extremities.   LABORATORY DATA: White blood cells 15.3, hemoglobin 10.6, hematocrit 25.4, platelets 447. Sodium 145, potassium 4.4, BUN 16, creatinine 1.10, glucose 203, albumin 1.7, MCV 73, TIBC 211, iron 17%, sat is 8, ferritin 33.   ASSESSMENT: 1. Bright red blood per rectum.  2. Microcytic anemia.  3. External hemorrhoids.  4. Malnutrition with an albumin of 1.7.  5. Stage IV sacral decubitus ulcer status post wound debridement of skin, soft tissue, muscle and bone for an area of 8 x 5 x 3 cm, done by Dr. Egbert Jennifer [name] on 09/19/2012. She does currently have a wound VAC and is on IV antibiotics. 6. Dementia, causing her to be bedridden and nonverbal.   PLAN: I have discussed this patient's case in detail with Dr. Lutricia [name], who is involved in the development of the patient's plan of care. At this time, we do recommend close monitoring of her H and H as it has shown some decline status post transfusion. We do recommend continuing to monitor closely and being prepared to transfuse as necessary. We do also agree with continuing the already initiated bowel care to prevent constipation as the patient  does have obvious external hemorrhoids on rectal exam, and certainly, if this is the etiology of bleeding, trying to alleviate constipation would be beneficial. The palliative care note is noted and appreciated, and it does appear that the family has agreed to discuss hospice at home. Also of note, the patient is DO NOT RESUSCITATE.   We did have a discussion regarding possible endoscopic intervention should her hemoglobin remain unstable. However, this would be quite difficult both for initiating a bowel prep as well as due to a close proximity of the wound VAC. There would be some concern for contamination risk. I will continue to monitor this patient throughout hospitalization and make further recommendations pending clinical course. Dr. Lutricia [name] will be seeing and evaluating this patient himself later on today. I had a long discussion with the family regarding differential diagnosis and possible management options, and all questions were answered.   The above plan of care was discussed and agreed upon under a supervisory agreement between myself and Dr. Lutricia [name].   Thank you so much for this consultation and for allowing [place] to participate in this patient's plan of care.  ____________________________ Stark Klein  Sharia Reeve, PA-Jennifer [name] kme:cb D: 09/24/2012 13:54:37 ET T: 09/24/2012 14:21:45 ET JOB#: [id number]  cc: Hardie Shackleton. Jazari Ober, PA-Jennifer [name], <Dictator> Hardie Shackleton Dalina Samara PA ELECTRONICALLY SIGNED 09/25/2012 11:04

## 2014-10-29 NOTE — Consult Note (Signed)
Brief Consult Note: Diagnosis: Stage 4 sacral decubitus ulcer.   Patient was seen by consultant.   Recommend to proceed with surgery or procedure.   Comments: discussed with family plan debridement in or in am, correct K.  Electronic Signatures: Natale LayBird, Mark (MD)  (Signed 14-Mar-14 19:10)  Authored: Brief Consult Note   Last Updated: 14-Mar-14 19:10 by Natale LayBird, Mark (MD)
